# Patient Record
Sex: Female | Born: 1945 | Race: Black or African American | Hispanic: No | Marital: Single | Smoking: Never smoker
Health system: Southern US, Community
[De-identification: ages and names within clinical notes are randomized; demographics above are authoritative.]

## PROBLEM LIST (undated history)

## (undated) DIAGNOSIS — I10 Essential (primary) hypertension: Secondary | ICD-10-CM

## (undated) DIAGNOSIS — E119 Type 2 diabetes mellitus without complications: Secondary | ICD-10-CM

## (undated) HISTORY — PX: CATARACT EXTRACTION, BILATERAL: SHX1313

## (undated) HISTORY — DX: Type 2 diabetes mellitus without complications: E11.9

## (undated) HISTORY — PX: WRIST SURGERY: SHX841

## (undated) HISTORY — DX: Essential (primary) hypertension: I10

---

## 2005-04-16 ENCOUNTER — Emergency Department: Payer: Self-pay | Admitting: Emergency Medicine

## 2006-05-05 DIAGNOSIS — I10 Essential (primary) hypertension: Secondary | ICD-10-CM | POA: Insufficient documentation

## 2006-05-05 DIAGNOSIS — Z833 Family history of diabetes mellitus: Secondary | ICD-10-CM | POA: Insufficient documentation

## 2007-09-01 ENCOUNTER — Emergency Department: Payer: Self-pay | Admitting: Emergency Medicine

## 2009-08-01 ENCOUNTER — Emergency Department: Payer: Self-pay | Admitting: Emergency Medicine

## 2009-08-12 ENCOUNTER — Emergency Department: Payer: Self-pay | Admitting: Unknown Physician Specialty

## 2011-04-02 ENCOUNTER — Ambulatory Visit: Payer: Self-pay

## 2011-07-02 ENCOUNTER — Emergency Department: Payer: Self-pay | Admitting: Emergency Medicine

## 2011-07-15 ENCOUNTER — Ambulatory Visit: Payer: Self-pay | Admitting: Orthopedic Surgery

## 2011-07-16 ENCOUNTER — Ambulatory Visit: Payer: Self-pay | Admitting: Orthopedic Surgery

## 2012-12-07 IMAGING — CR LEFT WRIST - 2 VIEW
1 series · 2 of 2 positions shown · non-contrast
Comparison: none

REASON FOR EXAM: post op ORIF
COMMENTS:   Bedside (portable):Y

PROCEDURE:     DXR - DXR WRIST LEFT AP AND LATERAL  - July 16, 2011 [DATE]
RESULT:     The patient has a comminuted fracture of the distal left radius
that has been reduced and is now internally stabilized by a T-shaped volar
plate with multiple screws. The hardware is intact. A splint is present.

[Series 1: view not recorded · 0.17mm/px · 2 of 2 slices shown]
[im 1/2]
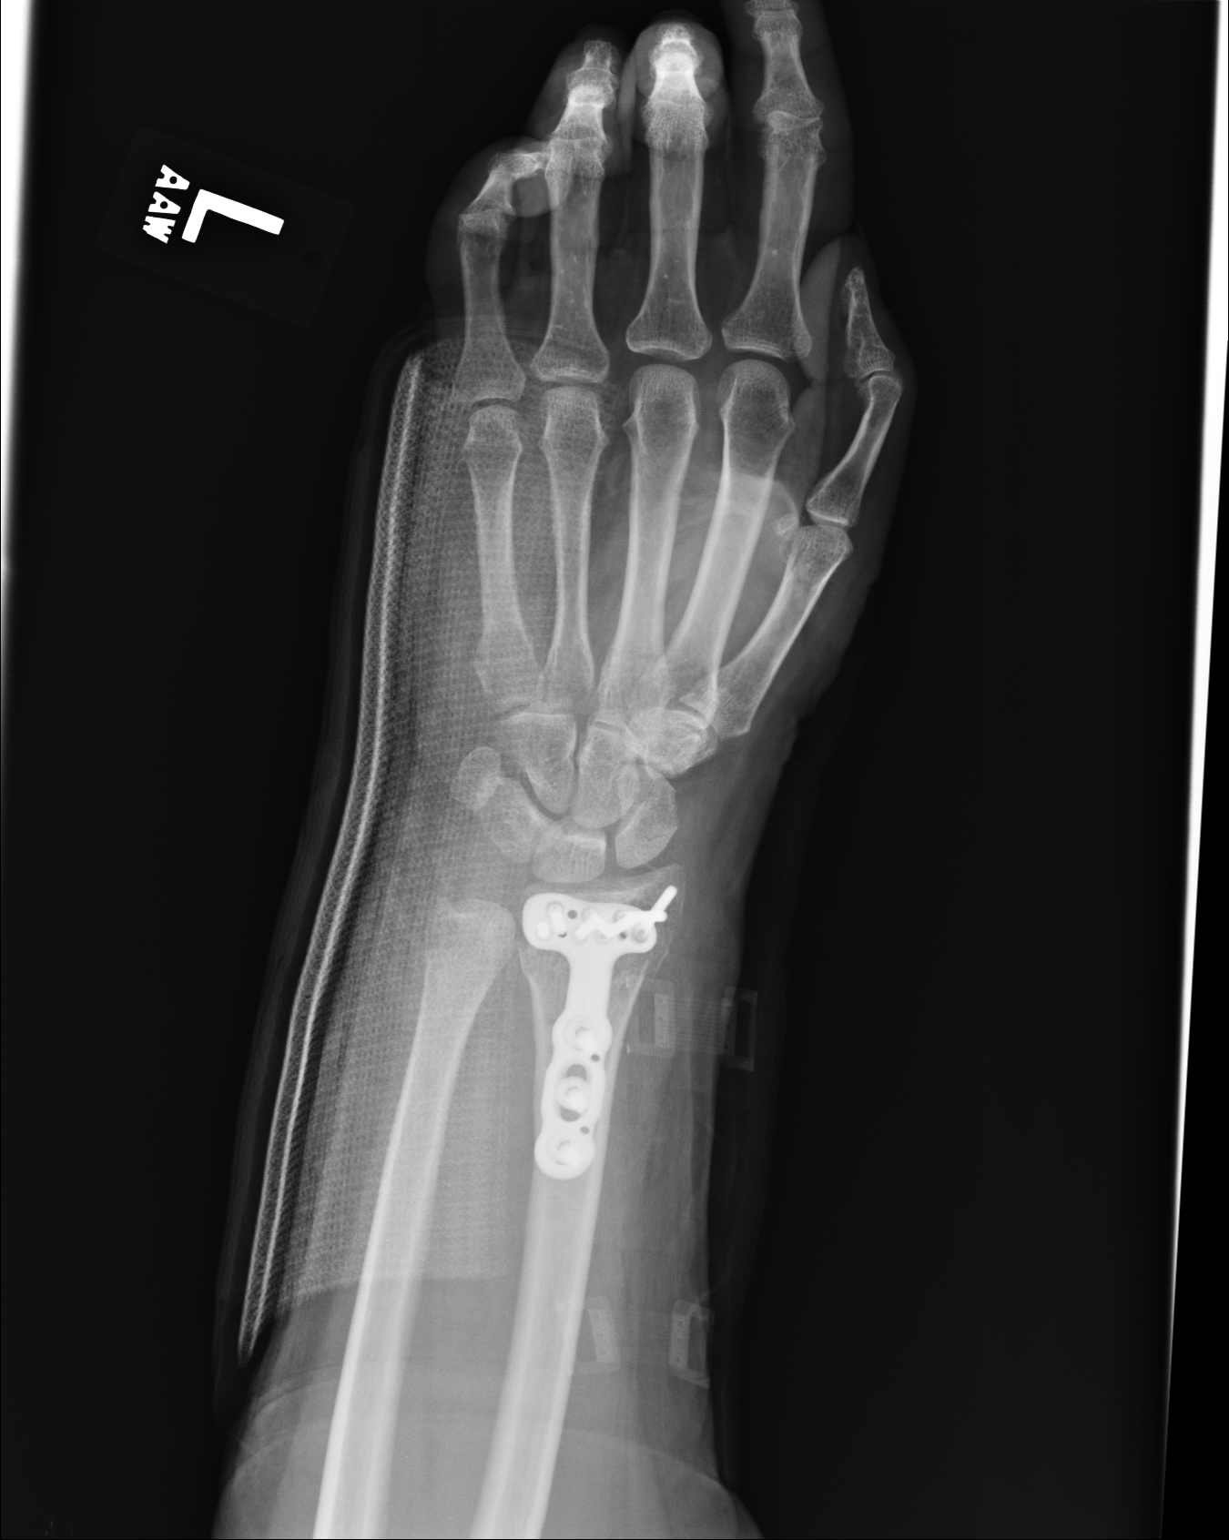
[im 2/2]
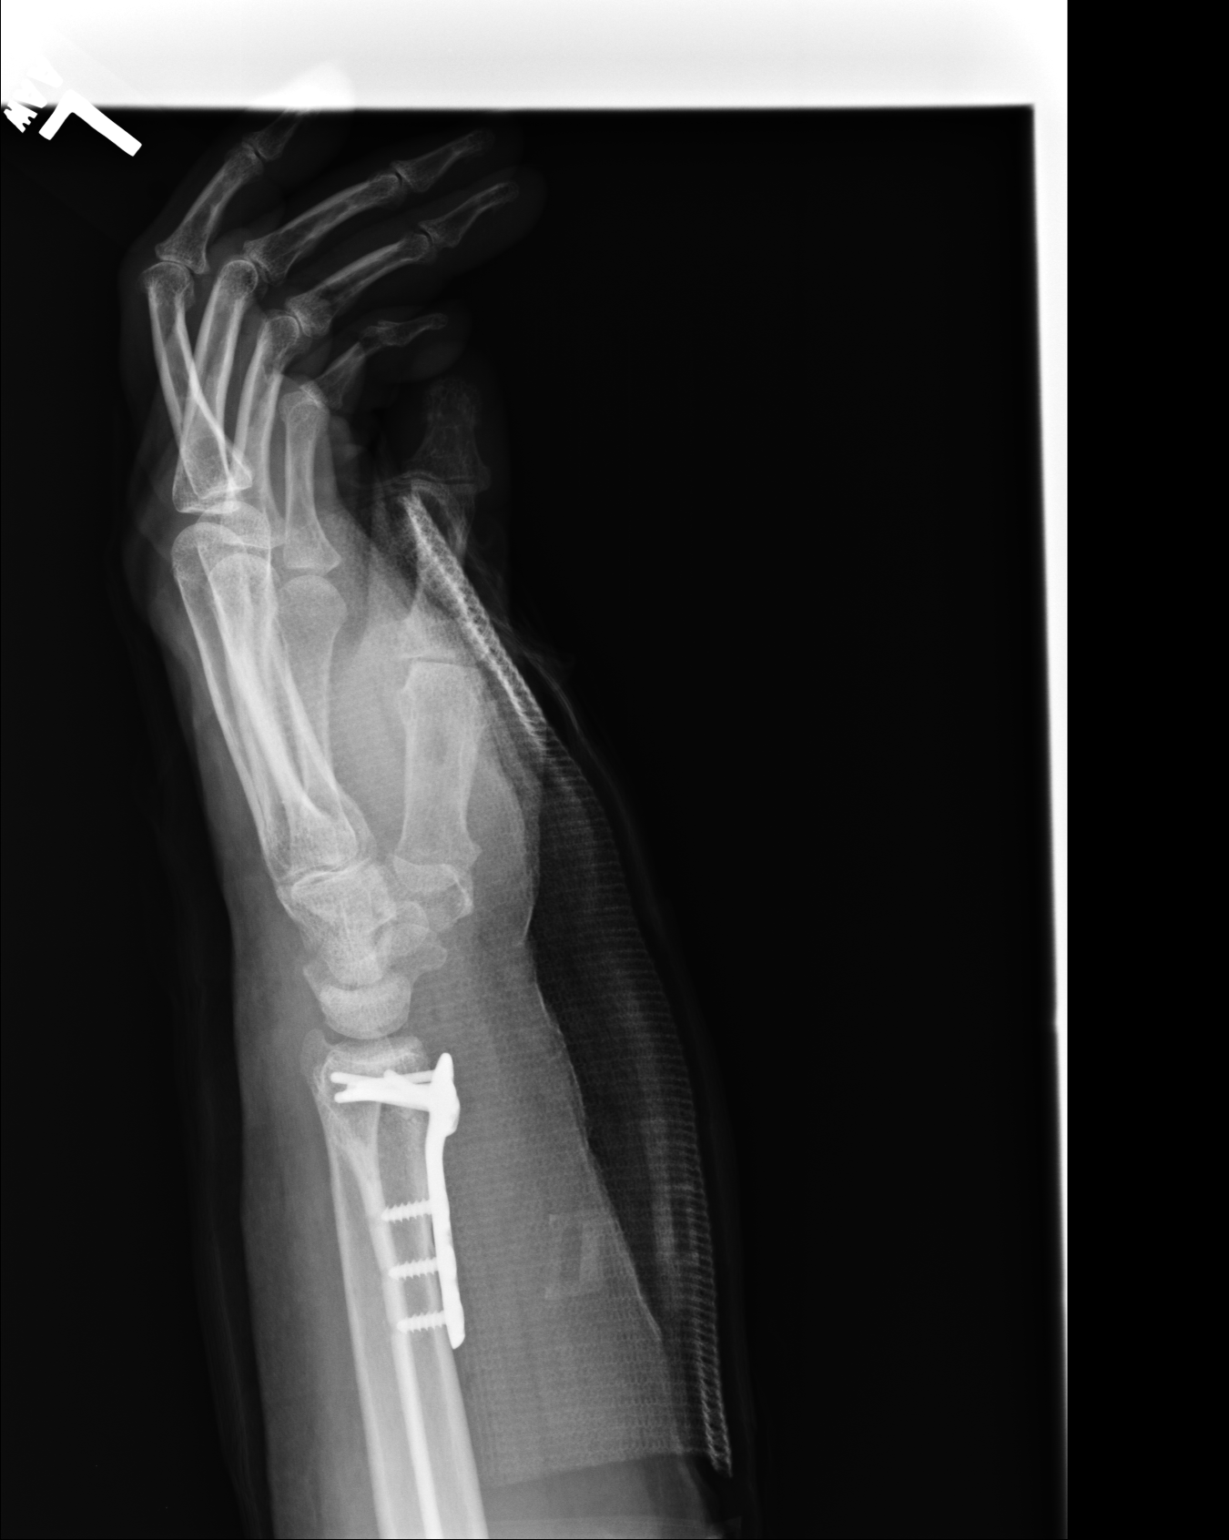

[2 of 2 positions shown; findings below may reference images not displayed]

IMPRESSION: The previously noted fracture of the distal left radius has been reduced and
internally stabilized as noted above.

## 2013-08-24 LAB — HEPATIC FUNCTION PANEL
ALT: 12 U/L (ref 7–35)
AST: 15 U/L (ref 13–35)
Alkaline Phosphatase: 76 U/L (ref 25–125)
Bilirubin, Total: 0.7 mg/dL

## 2013-08-24 LAB — LIPID PANEL
Cholesterol: 163 mg/dL (ref 0–200)
HDL: 40 mg/dL (ref 35–70)
LDL Cholesterol: 108 mg/dL
Triglycerides: 75 mg/dL (ref 40–160)

## 2013-08-24 LAB — CBC AND DIFFERENTIAL
HCT: 39 % (ref 36–46)
Hemoglobin: 13 g/dL (ref 12.0–16.0)
Neutrophils Absolute: 40 /uL
Platelets: 297 10*3/uL (ref 150–399)
WBC: 4.9 10*3/mL

## 2013-08-24 LAB — BASIC METABOLIC PANEL
BUN: 15 mg/dL (ref 4–21)
CREATININE: 0.8 mg/dL (ref 0.5–1.1)
Glucose: 92 mg/dL
POTASSIUM: 4.4 mmol/L (ref 3.4–5.3)
Sodium: 143 mmol/L (ref 137–147)

## 2013-08-24 LAB — TSH: TSH: 2.22 u[IU]/mL (ref 0.41–5.90)

## 2013-08-24 LAB — HEMOGLOBIN A1C: HEMOGLOBIN A1C: 7

## 2015-10-04 DIAGNOSIS — E119 Type 2 diabetes mellitus without complications: Secondary | ICD-10-CM | POA: Insufficient documentation

## 2015-10-06 ENCOUNTER — Encounter: Payer: Self-pay | Admitting: Family Medicine

## 2015-10-06 ENCOUNTER — Ambulatory Visit (INDEPENDENT_AMBULATORY_CARE_PROVIDER_SITE_OTHER): Payer: BLUE CROSS/BLUE SHIELD | Admitting: Family Medicine

## 2015-10-06 VITALS — BP 138/98 | HR 68 | Temp 97.6°F | Resp 14 | Wt 173.0 lb

## 2015-10-06 DIAGNOSIS — Z23 Encounter for immunization: Secondary | ICD-10-CM

## 2015-10-06 DIAGNOSIS — E119 Type 2 diabetes mellitus without complications: Secondary | ICD-10-CM

## 2015-10-06 DIAGNOSIS — I10 Essential (primary) hypertension: Secondary | ICD-10-CM | POA: Diagnosis not present

## 2015-10-06 LAB — POCT UA - MICROALBUMIN: MICROALBUMIN (UR) POC: NEGATIVE mg/L

## 2015-10-06 MED ORDER — LISINOPRIL-HYDROCHLOROTHIAZIDE 20-25 MG PO TABS: 1.0000 | ORAL_TABLET | Freq: Every day | ORAL | Status: DC

## 2015-10-06 MED ORDER — METFORMIN HCL 500 MG PO TABS: 500.0000 mg | ORAL_TABLET | Freq: Two times a day (BID) | ORAL | Status: DC

## 2015-10-06 NOTE — Progress Notes (Signed)
Patient ID: Krista Haley, female   DOB: Aug 10, 1946, 70 y.o.   MRN: [id number]   Patient: Krista Haley Female    DOB: [birth year]   70 y.o.   MRN: [id number] Visit Date: 10/06/2015  Today's Provider: Vernie Murders, PA   Chief Complaint  Patient presents with  . Hypertension  . Diabetes  . Follow-up   Subjective:    Hypertension This is a chronic problem. The current episode started more than 1 year ago. The problem is controlled. Associated symptoms include blurred vision. Pertinent negatives include no chest pain, palpitations or peripheral edema. Risk factors for coronary artery disease include diabetes mellitus. Past treatments include ACE inhibitors and diuretics. The current treatment provides significant improvement.  Diabetes She presents for her follow-up diabetic visit. She has type 2 diabetes mellitus. Her disease course has been stable. There are no hypoglycemic associated symptoms. Associated symptoms include blurred vision. Pertinent negatives for diabetes include no chest pain. (History of glaucoma and on eye drops now.) Symptoms are stable. 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 Diabetes mellitus, type 2 ([place]) 10/04/2015  . Adjustment disorder with depressed mood 07/21/2009  . Dermatitis, purulent 05/04/2008  . Essential (primary) hypertension 05/05/2006  . Family history of diabetes mellitus 05/05/2006   Past Surgical History  Procedure Laterality Date  . Wrist surgery Left    Family History  Problem Relation Age of Onset  . Diabetes Mother   . Hypertension Mother   . CVA Father   . Hypertension Father   . Heart attack Sister   . Diabetes Sister   . Kidney failure Sister   . COPD Brother   . Diabetes Brother   . Prostate cancer Paternal Grandfather   . Ovarian cancer Sister   . Hypertension Sister   . Lung cancer Sister    No Known Allergies   Previous Medications   IBUPROFEN  (MOTRIN IB) 200 MG TABLET    Take by mouth.   LISINOPRIL-HYDROCHLOROTHIAZIDE (PRINZIDE,ZESTORETIC) 20-25 MG TABLET    Take by mouth.   METFORMIN (GLUCOPHAGE) 500 MG TABLET    Take by mouth.   MULTIPLE VITAMINS-MINERALS PO    Take by mouth.    Review of Systems  Constitutional: Negative.   HENT: Negative.   Eyes: Positive for blurred vision.  Respiratory: Negative.   Cardiovascular: Negative.  Negative for chest pain and palpitations.  Gastrointestinal: Negative.   Endocrine: Negative.   Genitourinary: Negative.   Musculoskeletal: Negative.   Skin: Negative.   Allergic/Immunologic: Negative.   Neurological: Negative.   Hematological: Negative.   Psychiatric/Behavioral: Negative.     Social History  Substance Use Topics  . Smoking status: Never Smoker   . Smokeless tobacco: Never Used  . Alcohol Use: No   Objective:   BP 138/98 mmHg  Pulse 68  Temp(Src) 97.6 F (36.4 C) (Oral)  Resp 14  Wt 173 lb (78.472 kg)   Physical Exam  Constitutional: She is oriented to person, place, and time. She appears well-developed and well-nourished. No distress.  HENT:  Head: Normocephalic and atraumatic.  Right Ear: Hearing normal.  Left Ear: Hearing normal.  Nose: Nose normal.  Eyes: Conjunctivae, EOM and lids are normal. Right eye exhibits no discharge. Left eye exhibits no discharge. No scleral icterus.  Neck: Normal range of motion. Neck supple.  Cardiovascular: Normal rate, regular rhythm and normal heart sounds.   Pulmonary/Chest: Effort normal. No respiratory distress.  Abdominal: Soft. Bowel sounds are normal.  Musculoskeletal: Normal range of motion.  Neurological: She is alert and oriented to person, place, and time.  Normal sensation in both feet to test with nylon string.  Skin: Skin is intact. No lesion and no rash noted.  Psychiatric: She has a normal mood and affect. Her speech is normal and behavior is normal. Thought content normal.      Assessment & Plan:       1. Essential (primary) hypertension Has better control of BP at home. Fasting today and has not take Prinzide yet. Limit sodium intake and caffeine. Recheck routine labs and follow up pending report. - TSH - lisinopril-hydrochlorothiazide (PRINZIDE,ZESTORETIC) 20-25 MG tablet; Take 1 tablet by mouth daily.  Dispense: 90 tablet; Refill: 3  2. Type 2 diabetes mellitus without complication, without long-term current use of insulin ([place]) States she takes Metformin 500 mg 1 daily with occasional second does. RBS was 154 two days ago after eating at work (3rd shift). Will recheck labs and recommend she use the Metformin BID. Has had follow up with ophthalmologist and taking daily eyedrops for glaucoma now. Recheck pending lab reports. Urine microalbumin was negative today. Normal sensation in feet to test with nylon string. - CBC with Differential/Platelet - COMPLETE METABOLIC PANEL WITH GFR - Lipid panel - Hemoglobin A1c - metFORMIN (GLUCOPHAGE) 500 MG tablet; Take 1 tablet (500 mg total) by mouth 2 (two) times daily with a meal.  Dispense: 180 tablet; Refill: 3 - POCT UA - Microalbumin  3. Need for influenza vaccination - Flu Vaccine QUAD 36+ mos PF IM (Fluarix & Fluzone Quad PF)

## 2015-10-07 LAB — COMPREHENSIVE METABOLIC PANEL
ALBUMIN: 4.3 g/dL (ref 3.6–4.8)
ALT: 14 IU/L (ref 0–32)
AST: 16 IU/L (ref 0–40)
Albumin/Globulin Ratio: 1.5 (ref 1.1–2.5)
Alkaline Phosphatase: 89 IU/L (ref 39–117)
BUN / CREAT RATIO: 12 (ref 11–26)
BUN: 9 mg/dL (ref 8–27)
Bilirubin Total: 0.8 mg/dL (ref 0.0–1.2)
CALCIUM: 10.6 mg/dL — AB (ref 8.7–10.3)
CHLORIDE: 102 mmol/L (ref 96–106)
CO2: 27 mmol/L (ref 18–29)
Creatinine, Ser: 0.75 mg/dL (ref 0.57–1.00)
GFR calc Af Amer: 95 mL/min/{1.73_m2} (ref >59)
GFR calc non Af Amer: 82 mL/min/{1.73_m2} (ref >59)
GLOBULIN, TOTAL: 2.9 g/dL (ref 1.5–4.5)
GLUCOSE: 98 mg/dL (ref 65–99)
Potassium: 5.5 mmol/L — ABNORMAL HIGH (ref 3.5–5.2)
Sodium: 143 mmol/L (ref 134–144)
TOTAL PROTEIN: 7.2 g/dL (ref 6.0–8.5)

## 2015-10-07 LAB — CBC WITH DIFFERENTIAL/PLATELET
BASOS: 1 %
Basophils Absolute: 0 10*3/uL (ref 0.0–0.2)
EOS (ABSOLUTE): 0.1 10*3/uL (ref 0.0–0.4)
Eos: 1 %
Hematocrit: 40.9 % (ref 34.0–46.6)
Hemoglobin: 13.8 g/dL (ref 11.1–15.9)
IMMATURE GRANS (ABS): 0 10*3/uL (ref 0.0–0.1)
IMMATURE GRANULOCYTES: 0 %
LYMPHS: 48 %
Lymphocytes Absolute: 2.4 10*3/uL (ref 0.7–3.1)
MCH: 30.3 pg (ref 26.6–33.0)
MCHC: 33.7 g/dL (ref 31.5–35.7)
MCV: 90 fL (ref 79–97)
Monocytes Absolute: 0.4 10*3/uL (ref 0.1–0.9)
Monocytes: 7 %
NEUTROS PCT: 43 %
Neutrophils Absolute: 2.2 10*3/uL (ref 1.4–7.0)
PLATELETS: 303 10*3/uL (ref 150–379)
RBC: 4.56 x10E6/uL (ref 3.77–5.28)
RDW: 13.4 % (ref 12.3–15.4)
WBC: 5.1 10*3/uL (ref 3.4–10.8)

## 2015-10-07 LAB — HEMOGLOBIN A1C
Est. average glucose Bld gHb Est-mCnc: 146 mg/dL
Hgb A1c MFr Bld: 6.7 % — ABNORMAL HIGH (ref 4.8–5.6)

## 2015-10-07 LAB — LIPID PANEL
CHOLESTEROL TOTAL: 159 mg/dL (ref 100–199)
Chol/HDL Ratio: 3.5 ratio units (ref 0.0–4.4)
HDL: 45 mg/dL (ref >39)
LDL Calculated: 97 mg/dL (ref 0–99)
TRIGLYCERIDES: 85 mg/dL (ref 0–149)
VLDL CHOLESTEROL CAL: 17 mg/dL (ref 5–40)

## 2015-10-07 LAB — TSH: TSH: 2.17 u[IU]/mL (ref 0.450–4.500)

## 2015-10-10 ENCOUNTER — Telehealth: Payer: Self-pay

## 2015-10-10 NOTE — Telephone Encounter (Signed)
Number we have on file is no longer in service. Will mail letter saying that lab results are back.

## 2015-10-10 NOTE — Telephone Encounter (Signed)
-----   Message from Margo Common, [place] sent at 10/09/2015 11:26 PM EST ----- All blood tests essentially normal. Good Hgb A1C indicates diabetes well controlled with the Metformin and cholesterol in good shape. Get back on daily use of BP medication and restrict sodium intake. Recheck BP in 1 month to see if adjustment to BP medication needed.

## 2016-10-25 LAB — HM DIABETES EYE EXAM

## 2016-10-29 ENCOUNTER — Ambulatory Visit (INDEPENDENT_AMBULATORY_CARE_PROVIDER_SITE_OTHER): Payer: BLUE CROSS/BLUE SHIELD | Admitting: Family Medicine

## 2016-10-29 ENCOUNTER — Encounter: Payer: Self-pay | Admitting: Family Medicine

## 2016-10-29 VITALS — BP 116/82 | HR 72 | Temp 97.8°F | Resp 16 | Ht 64.0 in | Wt 178.0 lb

## 2016-10-29 DIAGNOSIS — M25541 Pain in joints of right hand: Secondary | ICD-10-CM

## 2016-10-29 DIAGNOSIS — E119 Type 2 diabetes mellitus without complications: Secondary | ICD-10-CM

## 2016-10-29 DIAGNOSIS — I10 Essential (primary) hypertension: Secondary | ICD-10-CM | POA: Diagnosis not present

## 2016-10-29 LAB — POCT GLYCOSYLATED HEMOGLOBIN (HGB A1C)
Est. average glucose Bld gHb Est-mCnc: 148
HEMOGLOBIN A1C: 6.8

## 2016-10-29 LAB — POCT UA - MICROALBUMIN: MICROALBUMIN (UR) POC: 20 mg/L

## 2016-10-29 MED ORDER — METFORMIN HCL 500 MG PO TABS: 500.0000 mg | ORAL_TABLET | Freq: Two times a day (BID) | ORAL | 3 refills | Status: DC

## 2016-10-29 MED ORDER — LISINOPRIL-HYDROCHLOROTHIAZIDE 20-25 MG PO TABS
1.0000 | ORAL_TABLET | Freq: Every day | ORAL | 3 refills | Status: DC
Start: 2016-10-29 — End: 2017-12-01

## 2016-10-29 NOTE — Progress Notes (Signed)
Patient: CHANEY OLIGER Female    DOB: 02-20-1946   71 y.o.   MRN: [id number] Visit Date: 10/29/2016  Today's Provider: Vernie Murders, PA   Chief Complaint  Patient presents with  . Hypertension  . Diabetes   Subjective:    HPI  Hypertension, follow-up:  BP Readings from Last 3 Encounters:  10/29/16 116/82  10/06/15 (!) 138/98    She was last seen for hypertension 1 years ago.  BP at that visit was 138/98. Management changes since that visit include check labs. She reports excellent compliance with treatment. She is not having side effects.  She is exercising. She is adherent to low salt diet.   Outside blood pressures are stable. She is experiencing none.  Patient denies chest pain and lower extremity edema.   Cardiovascular risk factors include advanced age (older than 67 for men, 68 for women), diabetes mellitus and hypertension.  Use of agents associated with hypertension: none.     Weight trend: stable Wt Readings from Last 3 Encounters:  10/29/16 178 lb (80.7 kg)  10/06/15 173 lb (78.5 kg)    Current diet: in general, a "healthy" diet    ------------------------------------------------------------------------   Diabetes Mellitus Type II, Follow-up:   Lab Results  Component Value Date   HGBA1C 6.8 10/29/2016   HGBA1C 6.7 (H) 10/06/2015   HGBA1C 7.0 08/24/2013    Last seen for diabetes 1 years ago.  Management since then includes check labs. She reports excellent compliance with treatment. She is not having side effects.  Current symptoms include none and have been stable. Home blood sugar records: fasting range: under 120  Episodes of hypoglycemia? no   Current Insulin Regimen: none Most Recent Eye Exam: 10/25/2016 Dr. Gloriann Loan Weight trend: stable Prior visit with dietician: no Current diet: in general, a "healthy" diet   Current exercise: walking  Pertinent Labs:    Component Value Date/Time   CHOL 159 10/06/2015 1144   TRIG 85 10/06/2015 1144   HDL 45 10/06/2015 1144   LDLCALC 97 10/06/2015 1144   CREATININE 0.75 10/06/2015 1144    Wt Readings from Last 3 Encounters:  10/29/16 178 lb (80.7 kg)  10/06/15 173 lb (78.5 kg)    ------------------------------------------------------------------------ Past Surgical History:  Procedure Laterality Date  . WRIST SURGERY Left    Family History  Problem Relation Age of Onset  . Diabetes Mother   . Hypertension Mother   . CVA Father   . Hypertension Father   . Heart attack Sister   . Diabetes Sister   . Kidney failure Sister   . COPD Brother   . Diabetes Brother   . Prostate cancer Paternal Grandfather   . Ovarian cancer Sister   . Hypertension Sister   . Lung cancer Sister     No Known Allergies   Patient Active Problem List   Diagnosis Date Noted  . Diabetes mellitus, type 2 ([place]) 10/04/2015  . Adjustment disorder with depressed mood 07/21/2009  . Dermatitis, purulent 05/04/2008  . Essential (primary) hypertension 05/05/2006  . Family history of diabetes mellitus 05/05/2006    Current Outpatient Prescriptions:  .  ibuprofen (MOTRIN IB) 200 MG tablet, Take by mouth., Disp: , Rfl:  .  lisinopril-hydrochlorothiazide (PRINZIDE,ZESTORETIC) 20-25 MG tablet, Take 1 tablet by mouth daily., Disp: 90 tablet, Rfl: 3 .  metFORMIN (GLUCOPHAGE) 500 MG tablet, Take 1 tablet (500 mg total) by mouth 2 (two) times daily with a meal., Disp: 180 tablet, Rfl: 3 .  MULTIPLE VITAMINS-MINERALS PO, Take by mouth., Disp: , Rfl:   Review of Systems  Constitutional: Negative.   Respiratory: Negative.   Cardiovascular: Negative.     Social History  Substance Use Topics  . Smoking status: Never Smoker  . Smokeless tobacco: Never Used  . Alcohol use No   Objective:   BP 116/82 (BP Location: Right Arm, Patient Position: Sitting, Cuff Size: Large)   Pulse 72   Temp 97.8 F (36.6 C) (Oral)   Resp 16   Ht 5\' 4"  (1.626 m)   Wt 178 lb (80.7 kg)   SpO2 99%    BMI 30.55 kg/m   Physical Exam  Constitutional: She is oriented to person, place, and time. She appears well-developed and well-nourished.  HENT:  Head: Normocephalic and atraumatic.  Right Ear: External ear normal.  Left Ear: External ear normal.  Nose: Nose normal.  Mouth/Throat: Oropharynx is clear and moist.  Eyes: Conjunctivae and EOM are normal. Pupils are equal, round, and reactive to light. Right eye exhibits no discharge.  Neck: Normal range of motion. Neck supple. No tracheal deviation present. No thyromegaly present.  Cardiovascular: Normal rate, regular rhythm, normal heart sounds and intact distal pulses.   No murmur heard. Pulmonary/Chest: Effort normal and breath sounds normal. No respiratory distress. She has no wheezes. She has no rales. She exhibits no tenderness.  Abdominal: Soft. She exhibits no distension and no mass. There is no tenderness. There is no rebound and no guarding.  Musculoskeletal: Normal range of motion. She exhibits no edema or tenderness.  Mild enlargement of PIP joints of the right 3rd and 4th fingers with intermittent soreness. Full ROM and good grip strength.  Lymphadenopathy:    She has no cervical adenopathy.  Neurological: She is alert and oriented to person, place, and time. No cranial nerve deficit. She exhibits normal muscle tone. Coordination normal.  Normal sensation in feet to test with nylon string. No callus or corn formation. No rashes or dry skin.  Skin: Skin is warm and dry. No rash noted. No erythema.  Psychiatric: She has a normal mood and affect. Her behavior is normal. Judgment and thought content normal.        Assessment & Plan:     1. Essential (primary) hypertension Improved BP. Tolerating Prinzide without side effects. Will check routine labs and refill medication. Recheck pending lab reports. - CBC with Differential/Platelet - Comprehensive metabolic panel - TSH - lisinopril-hydrochlorothiazide (PRINZIDE,ZESTORETIC)  20-25 MG tablet; Take 1 tablet by mouth daily.  Dispense: 90 tablet; Refill: 3  2. Type 2 diabetes mellitus without complication, without long-term current use of insulin (HCC) Hgb A1C 6.8 today. FBS around 100-120 at home (104 this morning). Having cataract surgery by Dr. Gloriann Loan in March 2018. Normal monofilament test of feet. Continue Metformin 500 mg 2 daily and diabetic diet. 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 Recheck labs and follow up pending reports. - POCT glycosylated hemoglobin (Hb A1C) - POCT UA - Microalbumin - CBC with Differential/Platelet - Comprehensive metabolic panel - Lipid panel - metFORMIN (GLUCOPHAGE) 500 MG tablet; Take 1 tablet (500 mg total) by mouth 2 (two) times daily with a meal.  Dispense: 180 tablet; Refill: 3  3. Arthralgia of right hand Some enlargement of the right 3rd and 4th finger PIP joints. May use Tylenol, Advil or Aspercreme for discomfort. Recheck prn.       Vernie Murders, PA  [facility]

## 2016-10-29 NOTE — Patient Instructions (Signed)
Arthritis Introduction Arthritis is a term that is commonly used to refer to joint pain or joint disease. There are more than 100 types of arthritis. What are the causes? The most common cause of this condition is wear and tear of a joint. Other causes include:  Gout.  Inflammation of a joint.  An infection of a joint.  Sprains and other injuries near the joint.  A drug reaction or allergic reaction. In some cases, the cause may not be known. What are the signs or symptoms? The main symptom of this condition is pain in the joint with movement. Other symptoms include:  Redness, swelling, or stiffness at a joint.  Warmth coming from the joint.  Fever.  Overall feeling of illness. How is this diagnosed? This condition may be diagnosed with a physical exam and tests, including:  Blood tests.  Urine tests.  Imaging tests, such as MRI, X-rays, or a CT scan. Sometimes, fluid is removed from a joint for testing. How is this treated? Treatment for this condition may involve:  Treatment of the cause, if it is known.  Rest.  Raising (elevating) the joint.  Applying cold or hot packs to the joint.  Medicines to improve symptoms and reduce inflammation.  Injections of a steroid such as cortisone into the joint to help reduce pain and inflammation. Depending on the cause of your arthritis, you may need to make lifestyle changes to reduce stress on your joint. These changes may include exercising more and losing weight. Follow these instructions at home: Medicines  Take over-the-counter and prescription medicines only as told by your health care provider.  Do not take aspirin to relieve pain if gout is suspected. Activity  Rest your joint if told by your health care provider. Rest is important when your disease is active and your joint feels painful, swollen, or stiff.  Avoid activities that make the pain worse. It is important to balance activity with rest.  Exercise  your joint regularly with range-of-motion exercises as told by your health care provider. Try doing low-impact exercise, such as:  Swimming.  Water aerobics.  Biking.  Walking. Joint Care   If your joint is swollen, keep it elevated if told by your health care provider.  If your joint feels stiff in the morning, try taking a warm shower.  If directed, apply heat to the joint. If you have diabetes, do not apply heat without permission from your health care provider.  Put a towel between the joint and the hot pack or heating pad.  Leave the heat on the area for 20-30 minutes.  If directed, apply ice to the joint:  Put ice in a plastic bag.  Place a towel between your skin and the bag.  Leave the ice on for 20 minutes, 2-3 times per day.  Keep all follow-up visits as told by your health care provider. This is important. Contact a health care provider if:  The pain gets worse.  You have a fever. Get help right away if:  You develop severe joint pain, swelling, or redness.  Many joints become painful and swollen.  You develop severe back pain.  You develop severe weakness in your leg.  You cannot control your bladder or bowels. This information is not intended to replace advice given to you by your health care provider. Make sure you discuss any questions you have with your health care provider. Document Released: 10/24/2004 Document Revised: 02/22/2016 Document Reviewed: 12/12/2014  2017 Elsevier  

## 2016-10-30 LAB — COMPREHENSIVE METABOLIC PANEL
A/G RATIO: 1.5 (ref 1.2–2.2)
ALBUMIN: 4.4 g/dL (ref 3.6–4.8)
ALK PHOS: 84 IU/L (ref 39–117)
ALT: 12 IU/L (ref 0–32)
AST: 15 IU/L (ref 0–40)
BILIRUBIN TOTAL: 0.6 mg/dL (ref 0.0–1.2)
BUN / CREAT RATIO: 18 (ref 12–28)
BUN: 14 mg/dL (ref 8–27)
CHLORIDE: 101 mmol/L (ref 96–106)
CO2: 30 mmol/L — ABNORMAL HIGH (ref 18–29)
Calcium: 10.1 mg/dL (ref 8.7–10.3)
Creatinine, Ser: 0.76 mg/dL (ref 0.57–1.00)
GFR calc Af Amer: 93 mL/min/{1.73_m2} (ref >59)
GFR calc non Af Amer: 80 mL/min/{1.73_m2} (ref >59)
Globulin, Total: 3 g/dL (ref 1.5–4.5)
Glucose: 109 mg/dL — ABNORMAL HIGH (ref 65–99)
POTASSIUM: 3.7 mmol/L (ref 3.5–5.2)
Sodium: 144 mmol/L (ref 134–144)
Total Protein: 7.4 g/dL (ref 6.0–8.5)

## 2016-10-30 LAB — LIPID PANEL
CHOLESTEROL TOTAL: 174 mg/dL (ref 100–199)
Chol/HDL Ratio: 4 ratio units (ref 0.0–4.4)
HDL: 43 mg/dL (ref >39)
LDL Calculated: 114 mg/dL — ABNORMAL HIGH (ref 0–99)
Triglycerides: 84 mg/dL (ref 0–149)
VLDL CHOLESTEROL CAL: 17 mg/dL (ref 5–40)

## 2016-10-30 LAB — CBC WITH DIFFERENTIAL/PLATELET
BASOS ABS: 0 10*3/uL (ref 0.0–0.2)
Basos: 0 %
EOS (ABSOLUTE): 0.1 10*3/uL (ref 0.0–0.4)
Eos: 1 %
HEMOGLOBIN: 13 g/dL (ref 11.1–15.9)
Hematocrit: 38.2 % (ref 34.0–46.6)
Immature Grans (Abs): 0 10*3/uL (ref 0.0–0.1)
Immature Granulocytes: 0 %
LYMPHS ABS: 3.2 10*3/uL — AB (ref 0.7–3.1)
Lymphs: 49 %
MCH: 30.4 pg (ref 26.6–33.0)
MCHC: 34 g/dL (ref 31.5–35.7)
MCV: 90 fL (ref 79–97)
Monocytes Absolute: 0.5 10*3/uL (ref 0.1–0.9)
Monocytes: 7 %
NEUTROS ABS: 2.8 10*3/uL (ref 1.4–7.0)
Neutrophils: 43 %
PLATELETS: 312 10*3/uL (ref 150–379)
RBC: 4.27 x10E6/uL (ref 3.77–5.28)
RDW: 13.5 % (ref 12.3–15.4)
WBC: 6.6 10*3/uL (ref 3.4–10.8)

## 2016-10-30 LAB — TSH: TSH: 3.11 u[IU]/mL (ref 0.450–4.500)

## 2016-12-02 ENCOUNTER — Telehealth: Payer: Self-pay | Admitting: Family Medicine

## 2016-12-02 NOTE — Telephone Encounter (Signed)
Pt is returning call about her lab results.  CB# [phone]

## 2016-12-02 NOTE — Telephone Encounter (Signed)
Lmtcb. Oreste Majeed Drozdowski, CMA  

## 2016-12-03 NOTE — Telephone Encounter (Signed)
Pt advised of most recent lab results and 3 month FU scheduled. Renaldo Fiddler, CMA

## 2017-03-06 ENCOUNTER — Ambulatory Visit (INDEPENDENT_AMBULATORY_CARE_PROVIDER_SITE_OTHER): Payer: BLUE CROSS/BLUE SHIELD | Admitting: Family Medicine

## 2017-03-06 ENCOUNTER — Encounter: Payer: Self-pay | Admitting: Family Medicine

## 2017-03-06 VITALS — BP 130/82 | HR 72 | Temp 97.8°F | Wt 177.0 lb

## 2017-03-06 DIAGNOSIS — I10 Essential (primary) hypertension: Secondary | ICD-10-CM

## 2017-03-06 DIAGNOSIS — E119 Type 2 diabetes mellitus without complications: Secondary | ICD-10-CM | POA: Diagnosis not present

## 2017-03-06 DIAGNOSIS — Z1159 Encounter for screening for other viral diseases: Secondary | ICD-10-CM

## 2017-03-06 NOTE — Progress Notes (Signed)
Patient: Krista Haley Female    DOB: 1946-07-05   71 y.o.   MRN: [id number] Visit Date: 03/06/2017  Today's Provider: Vernie Murders, PA   Chief Complaint  Patient presents with  . Diabetes  . Hypertension  . Follow-up   Subjective:    HPI  Diabetes Mellitus Type II, Follow-up:   Lab Results  Component Value Date   HGBA1C 6.8 10/29/2016   HGBA1C 6.7 (H) 10/06/2015   HGBA1C 7.0 08/24/2013   Last seen for diabetes 4 months ago.  Management since then includes continue medications and watch fats in diet.  She reports good compliance with treatment. She is not having side effects.  Current symptoms include none  Home blood sugar records: being checked at times. This morning FBS was 130  Episodes of hypoglycemia? no   Current Insulin Regimen: none Most Recent Eye Exam: 10/25/2016 Dr. Gloriann Loan Weight trend: stable Prior visit with dietician: no Current diet: in general, a "healthy" diet   Current exercise: walking  ------------------------------------------------------------------------   Hypertension, follow-up:  BP Readings from Last 3 Encounters:  03/06/17 130/82  10/29/16 116/82  10/06/15 (!) 138/98    She was last seen for hypertension 4 months ago.  BP at that visit was 116/82. Management since that visit includes continue medications and watch fats in diet. .She reports good compliance with treatment. She is not having side effects.  She is exercising. She is adherent to low salt diet.   Outside blood pressures are not being checked. She is experiencing none.  Patient denies chest pain, chest pressure/discomfort, irregular heart beat and palpitations.   Cardiovascular risk factors include advanced age (older than 22 for men, 43 for women), diabetes mellitus and hypertension.  Use of agents associated with hypertension: none.   ------------------------------------------------------------------------ Patient Active Problem List   Diagnosis Date  Noted  . Diabetes mellitus, type 2 ([place]) 10/04/2015  . Adjustment disorder with depressed mood 07/21/2009  . Dermatitis, purulent 05/04/2008  . Essential (primary) hypertension 05/05/2006  . Family history of diabetes mellitus 05/05/2006   Past Surgical History:  Procedure Laterality Date  . WRIST SURGERY Left    Family History  Problem Relation Age of Onset  . Diabetes Mother   . Hypertension Mother   . CVA Father   . Hypertension Father   . Heart attack Sister   . Diabetes Sister   . Kidney failure Sister   . COPD Brother   . Diabetes Brother   . Prostate cancer Paternal Grandfather   . Ovarian cancer Sister   . Hypertension Sister   . Lung cancer Sister    No Known Allergies   Previous Medications   IBUPROFEN (MOTRIN IB) 200 MG TABLET    Take by mouth.   LISINOPRIL-HYDROCHLOROTHIAZIDE (PRINZIDE,ZESTORETIC) 20-25 MG TABLET    Take 1 tablet by mouth daily.   METFORMIN (GLUCOPHAGE) 500 MG TABLET    Take 1 tablet (500 mg total) by mouth 2 (two) times daily with a meal.   MULTIPLE VITAMINS-MINERALS PO    Take by mouth.    Review of Systems  Constitutional: Negative.   Respiratory: Negative.   Cardiovascular: Negative.   Endocrine: Negative.     Social History  Substance Use Topics  . Smoking status: Never Smoker  . Smokeless tobacco: Never Used  . Alcohol use No   Objective:   BP 130/82 (BP Location: Right Arm, Patient Position: Sitting, Cuff Size: Normal)   Pulse 72   Temp 97.8 F (36.6 C) (Oral)  Wt 177 lb (80.3 kg)   SpO2 99%   BMI 30.38 kg/m   Physical Exam  Constitutional: She is oriented to person, place, and time. She appears well-developed and well-nourished. No distress.  HENT:  Head: Normocephalic and atraumatic.  Right Ear: Hearing normal.  Left Ear: Hearing normal.  Nose: Nose normal.  Eyes: Conjunctivae and lids are normal. Right eye exhibits no discharge. Left eye exhibits no discharge. No scleral icterus.  Neck: Neck supple. No  thyromegaly present.  Cardiovascular: Normal rate and regular rhythm.   Pulmonary/Chest: Effort normal and breath sounds normal. No respiratory distress.  Abdominal: Soft. Bowel sounds are normal.  Musculoskeletal: Normal range of motion.  Neurological: She is alert and oriented to person, place, and time.  Normal sensation both feet to monofilament test.  Skin: Skin is intact. No lesion and no rash noted.  Psychiatric: She has a normal mood and affect. Her speech is normal and behavior is normal. Thought content normal.      Assessment & Plan:     1. Type 2 diabetes mellitus without complication, without long-term current use of insulin (HCC) Has been taking Metformin 500 mg once a day instead of twice a day. States she has had one 209 FBS reading recently. Had cataract surgery and follow up eye exams March and April this year. Foot exam normal without peripheral neuropathy. Continue to work on diet and take Metformin BID regularly. Recheck labs. - CBC with Differential/Platelet - Comprehensive metabolic panel - Hemoglobin A1c  2. Essential (primary) hypertension Good control and tolerating Prinzide 20-25 mg qd without edema or cough. Recheck CBC and CMP. Follow up pending lab reports. - CBC with Differential/Platelet - Comprehensive metabolic panel  3. Need for hepatitis C screening test - Hepatitis C Antibody

## 2017-03-11 ENCOUNTER — Telehealth: Payer: Self-pay | Admitting: Family Medicine

## 2017-03-11 NOTE — Telephone Encounter (Signed)
Contact number in chart is invalid. Letter mailed to patient.

## 2017-03-21 ENCOUNTER — Ambulatory Visit (INDEPENDENT_AMBULATORY_CARE_PROVIDER_SITE_OTHER): Payer: BLUE CROSS/BLUE SHIELD | Admitting: Family Medicine

## 2017-03-21 ENCOUNTER — Encounter: Payer: Self-pay | Admitting: Family Medicine

## 2017-03-21 VITALS — BP 122/80 | HR 60 | Temp 98.6°F | Resp 16 | Wt 175.6 lb

## 2017-03-21 DIAGNOSIS — J069 Acute upper respiratory infection, unspecified: Secondary | ICD-10-CM

## 2017-03-21 MED ORDER — AMOXICILLIN-POT CLAVULANATE 875-125 MG PO TABS: 1.0000 | ORAL_TABLET | Freq: Two times a day (BID) | ORAL | 0 refills | Status: DC

## 2017-03-21 NOTE — Progress Notes (Signed)
Subjective:     Patient ID: Krista Haley, female   DOB: May 13, 1946, 71 y.o.   MRN: [id number]  HPI  Chief Complaint  Patient presents with  . Sinus Problem    Patient comes into office today with complaints of sinus pressure covering her entire head for the past 7 days or more. Associated with sinus pressure paitent complains of watery eyes and runny nose, patient has been taking otc Ibuprofen for relief.   Reports cold sx with continuing clear sinus drainage and sneezing. Denies hx of allergies.   Review of Systems     Objective:   Physical Exam  Constitutional: She appears well-developed and well-nourished. No distress.  Ears: T.M's intact without inflammation Sinuses: non-tender Throat: no tonsillar enlargement or exudate Neck: no cervical adenopathy Lungs: clear     Assessment:    1. Viral upper respiratory tract infection - amoxicillin-clavulanate (AUGMENTIN) 875-125 MG tablet; Take 1 tablet by mouth 2 (two) times daily.  Dispense: 20 tablet; Refill: 0    Plan:    Start Mucinex D. Start abx for non-improvement over the next two days or onset of purulent drainage.

## 2017-03-21 NOTE — Patient Instructions (Signed)
Start with Mucinex D. If sinuses not clearing over the next 2-3 days or you see colored drainage start the antibiotic.

## 2017-03-22 LAB — CBC WITH DIFFERENTIAL/PLATELET
BASOS: 0 %
Basophils Absolute: 0 10*3/uL (ref 0.0–0.2)
EOS (ABSOLUTE): 0.1 10*3/uL (ref 0.0–0.4)
EOS: 2 %
HEMATOCRIT: 39.4 % (ref 34.0–46.6)
HEMOGLOBIN: 12.6 g/dL (ref 11.1–15.9)
IMMATURE GRANULOCYTES: 0 %
Immature Grans (Abs): 0 10*3/uL (ref 0.0–0.1)
LYMPHS ABS: 1.9 10*3/uL (ref 0.7–3.1)
Lymphs: 46 %
MCH: 28.6 pg (ref 26.6–33.0)
MCHC: 32 g/dL (ref 31.5–35.7)
MCV: 89 fL (ref 79–97)
MONOCYTES: 6 %
MONOS ABS: 0.2 10*3/uL (ref 0.1–0.9)
NEUTROS PCT: 46 %
Neutrophils Absolute: 1.9 10*3/uL (ref 1.4–7.0)
Platelets: 299 10*3/uL (ref 150–379)
RBC: 4.41 x10E6/uL (ref 3.77–5.28)
RDW: 13.6 % (ref 12.3–15.4)
WBC: 4.2 10*3/uL (ref 3.4–10.8)

## 2017-03-22 LAB — COMPREHENSIVE METABOLIC PANEL
A/G RATIO: 1.5 (ref 1.2–2.2)
ALBUMIN: 4 g/dL (ref 3.6–4.8)
ALT: 10 IU/L (ref 0–32)
AST: 16 IU/L (ref 0–40)
Alkaline Phosphatase: 76 IU/L (ref 39–117)
BUN / CREAT RATIO: 19 (ref 12–28)
BUN: 15 mg/dL (ref 8–27)
Bilirubin Total: 0.6 mg/dL (ref 0.0–1.2)
CALCIUM: 9.9 mg/dL (ref 8.7–10.3)
CO2: 29 mmol/L (ref 20–29)
CREATININE: 0.78 mg/dL (ref 0.57–1.00)
Chloride: 105 mmol/L (ref 96–106)
GFR, EST AFRICAN AMERICAN: 90 mL/min/{1.73_m2} (ref >59)
GFR, EST NON AFRICAN AMERICAN: 78 mL/min/{1.73_m2} (ref >59)
GLOBULIN, TOTAL: 2.7 g/dL (ref 1.5–4.5)
Glucose: 108 mg/dL — ABNORMAL HIGH (ref 65–99)
POTASSIUM: 4 mmol/L (ref 3.5–5.2)
SODIUM: 147 mmol/L — AB (ref 134–144)
TOTAL PROTEIN: 6.7 g/dL (ref 6.0–8.5)

## 2017-03-22 LAB — HEPATITIS C ANTIBODY: Hep C Virus Ab: <0.1 s/co ratio (ref 0.0–0.9)

## 2017-03-22 LAB — HEMOGLOBIN A1C
ESTIMATED AVERAGE GLUCOSE: 134 mg/dL
Hgb A1c MFr Bld: 6.3 % — ABNORMAL HIGH (ref 4.8–5.6)

## 2017-03-24 ENCOUNTER — Telehealth: Payer: Self-pay

## 2017-03-24 NOTE — Telephone Encounter (Signed)
Attempted to contact patient. Number in chart is invalid. Letter mailed to patient.

## 2017-03-24 NOTE — Telephone Encounter (Signed)
-----   Message from Margo Common, [place] sent at 03/24/2017  8:08 AM EDT ----- Diabetes in good control. Continue present diet and medications. Sodium slightly elevated. Be sure to drink at least 32 ounce of water a day. Recheck progress in 6 months.

## 2017-12-01 ENCOUNTER — Other Ambulatory Visit: Payer: Self-pay | Admitting: Family Medicine

## 2017-12-01 DIAGNOSIS — E119 Type 2 diabetes mellitus without complications: Secondary | ICD-10-CM

## 2017-12-01 DIAGNOSIS — I10 Essential (primary) hypertension: Secondary | ICD-10-CM

## 2017-12-01 MED ORDER — METFORMIN HCL 500 MG PO TABS: 500.0000 mg | ORAL_TABLET | Freq: Two times a day (BID) | ORAL | 3 refills | Status: AC

## 2017-12-01 MED ORDER — LISINOPRIL-HYDROCHLOROTHIAZIDE 20-25 MG PO TABS: 1.0000 | ORAL_TABLET | Freq: Every day | ORAL | 3 refills | Status: AC

## 2017-12-01 NOTE — Telephone Encounter (Signed)
Patient is requesting a refill on the following medications  lisinopril-hydrochlorothiazide (PRINZIDE,ZESTORETIC) 20-25 MG tablet   metFORMIN (GLUCOPHAGE) 500 MG tablet  [facility] on [address]

## 2017-12-04 ENCOUNTER — Encounter: Payer: Self-pay | Admitting: Family Medicine

## 2017-12-04 ENCOUNTER — Ambulatory Visit (INDEPENDENT_AMBULATORY_CARE_PROVIDER_SITE_OTHER): Payer: BLUE CROSS/BLUE SHIELD | Admitting: Family Medicine

## 2017-12-04 VITALS — BP 132/84 | HR 70 | Temp 97.8°F | Ht 64.0 in | Wt 173.8 lb

## 2017-12-04 DIAGNOSIS — Z13820 Encounter for screening for osteoporosis: Secondary | ICD-10-CM | POA: Diagnosis not present

## 2017-12-04 DIAGNOSIS — Z1231 Encounter for screening mammogram for malignant neoplasm of breast: Secondary | ICD-10-CM | POA: Diagnosis not present

## 2017-12-04 DIAGNOSIS — Z0000 Encounter for general adult medical examination without abnormal findings: Secondary | ICD-10-CM | POA: Diagnosis not present

## 2017-12-04 DIAGNOSIS — E119 Type 2 diabetes mellitus without complications: Secondary | ICD-10-CM | POA: Diagnosis not present

## 2017-12-04 DIAGNOSIS — I10 Essential (primary) hypertension: Secondary | ICD-10-CM

## 2017-12-04 DIAGNOSIS — Z1211 Encounter for screening for malignant neoplasm of colon: Secondary | ICD-10-CM

## 2017-12-04 DIAGNOSIS — Z23 Encounter for immunization: Secondary | ICD-10-CM | POA: Diagnosis not present

## 2017-12-04 NOTE — Progress Notes (Signed)
Patient: Krista Haley, Female    DOB: 01-27-1946, 72 y.o.   MRN: [id number] Visit Date: 12/04/2017  Today's Provider: Vernie Murders, PA   Chief Complaint  Patient presents with  . Annual Exam   Subjective:    Annual physical exam Krista Haley is a 72 y.o. female who presents today for health maintenance and complete physical. She feels well. She reports exercising none. She reports she is sleeping well.  -----------------------------------------------------------------   Review of Systems  Constitutional: Negative.   HENT: Negative.   Eyes: Negative.   Respiratory: Negative.   Cardiovascular: Negative.   Gastrointestinal: Negative.   Endocrine: Negative.   Genitourinary: Negative.   Musculoskeletal: Negative.   Skin: Negative.   Allergic/Immunologic: Negative.   Neurological: Negative.   Hematological: Negative.   Psychiatric/Behavioral: Negative.     Social History      She  reports that  has never smoked. she has never used smokeless tobacco. She reports that she does not drink alcohol or use drugs.       Social History   Socioeconomic History  . Marital status: Single    Spouse name: None  . Number of children: None  . Years of education: None  . Highest education level: None  Social Needs  . Financial resource strain: None  . Food insecurity - worry: None  . Food insecurity - inability: None  . Transportation needs - medical: None  . Transportation needs - non-medical: None  Occupational History  . None  Tobacco Use  . Smoking status: Never Smoker  . Smokeless tobacco: Never Used  Substance and Sexual Activity  . Alcohol use: No    Alcohol/week: 0.0 oz  . Drug use: No  . Sexual activity: None  Other Topics Concern  . None  Social History Narrative  . None   History reviewed. No pertinent past medical history.  Patient Active Problem List   Diagnosis Date Noted  . Diabetes mellitus, type 2 ([place]) 10/04/2015  . Adjustment  disorder with depressed mood 07/21/2009  . Dermatitis, purulent 05/04/2008  . Essential (primary) hypertension 05/05/2006  . Family history of diabetes mellitus 05/05/2006   Past Surgical History:  Procedure Laterality Date  . WRIST SURGERY Left     Family History        Family Status  Relation Name Status  . Mother  Deceased at age 44  . Father  Deceased at age 58  . Sister  Deceased  . Brother  Deceased at age 62  . MGF  Deceased  . PGF  Deceased  . Sister  Deceased  . Sister  Deceased        Her family history includes COPD in her brother; CVA in her father; Diabetes in her brother, mother, and sister; Heart attack in her sister; Hypertension in her father, mother, and sister; Kidney failure in her sister; Lung cancer in her sister; Ovarian cancer in her sister; Prostate cancer in her paternal grandfather.     No Known Allergies  Current Outpatient Medications:  .  ibuprofen (MOTRIN IB) 200 MG tablet, Take by mouth., Disp: , Rfl:  .  lisinopril-hydrochlorothiazide (PRINZIDE,ZESTORETIC) 20-25 MG tablet, Take 1 tablet by mouth daily., Disp: 90 tablet, Rfl: 3 .  metFORMIN (GLUCOPHAGE) 500 MG tablet, Take 1 tablet (500 mg total) by mouth 2 (two) times daily with a meal., Disp: 180 tablet, Rfl: 3 .  MULTIPLE VITAMINS-MINERALS PO, Take by mouth., Disp: , Rfl:    Patient Care Team:  Arelis Neumeier, Vickki Muff, PA as PCP - General (Family Medicine)     Objective:   Vitals: BP 132/84 (BP Location: Right Arm, Patient Position: Sitting, Cuff Size: Normal)   Pulse 70   Temp 97.8 F (36.6 C) (Oral)   Ht 5\' 4"  (1.626 m)   Wt 173 lb 12.8 oz (78.8 kg)   SpO2 98%   BMI 29.83 kg/m  Wt Readings from Last 3 Encounters:  12/04/17 173 lb 12.8 oz (78.8 kg)  03/21/17 175 lb 9.6 oz (79.7 kg)  03/06/17 177 lb (80.3 kg)   BP Readings from Last 3 Encounters:  12/04/17 132/84  03/21/17 122/80  03/06/17 130/82   Physical Exam  Constitutional: She is oriented to person, place, and time. She  appears well-developed and well-nourished.  HENT:  Head: Normocephalic and atraumatic.  Right Ear: External ear normal.  Left Ear: External ear normal.  Nose: Nose normal.  Mouth/Throat: Oropharynx is clear and moist.  Eyes: Conjunctivae and EOM are normal. Pupils are equal, round, and reactive to light. Right eye exhibits no discharge.  Neck: Normal range of motion. Neck supple. No tracheal deviation present. No thyromegaly present.  Cardiovascular: Normal rate, regular rhythm, normal heart sounds and intact distal pulses.  No murmur heard. Pulmonary/Chest: Effort normal and breath sounds normal. No respiratory distress. She has no wheezes. She has no rales. She exhibits no tenderness.  Abdominal: Soft. She exhibits no distension and no mass. There is no tenderness. There is no rebound and no guarding.  Genitourinary:  Genitourinary Comments: Declines exam. Asymptomatic and has not noticed any breast masses on self-exam at home.  Musculoskeletal: Normal range of motion. She exhibits no edema or tenderness.  Lymphadenopathy:    She has no cervical adenopathy.  Neurological: She is alert and oriented to person, place, and time. She has normal reflexes. No cranial nerve deficit. She exhibits normal muscle tone. Coordination normal.  Skin: Skin is warm and dry. No rash noted. No erythema.  Psychiatric: She has a normal mood and affect. Her behavior is normal. Judgment and thought content normal.   Diabetic Foot Form - Detailed   Diabetic Foot Exam - detailed Diabetic Foot exam was performed with the following findings:  Yes 12/04/2017  2:57 PM  Visual Foot Exam completed.:  Yes  Can the patient see the bottom of their feet?:  Yes Are the shoes appropriate in style and fit?:  Yes Is there swelling or and abnormal foot shape?:  No Is there a claw toe deformity?:  No Is there elevated skin temparature?:  No Is there foot or ankle muscle weakness?:  No Normal Range of Motion:  Yes Pulse Foot  Exam completed.:  Yes  Right posterior Tibialias:  Present Left posterior Tibialias:  Present  Right Dorsalis Pedis:  Present Left Dorsalis Pedis:  Present  Sensory Foot Exam Completed.:  Yes Semmes-Weinstein Monofilament Test R Site 1-Great Toe:  Pos L Site 1-Great Toe:  Pos        Depression Screen PHQ 2/9 Scores 10/29/2016  PHQ - 2 Score 0   Assessment & Plan:     Routine Health Maintenance and Physical Exam  Exercise Activities and Dietary recommendations Goals    Encouraged to exercise by walking 30 minutes 3-4 days a week and follow diabetic diet.      Immunization History  Administered Date(s) Administered  . Influenza Split 10/27/2009  . Influenza,inj,Quad PF,6+ Mos 08/24/2013, 10/06/2015    Health Maintenance  Topic Date Due  . Samul Dada  04/17/1966  . MAMMOGRAM  04/17/1997  . COLONOSCOPY  04/17/1997  . DEXA SCAN  04/17/2012  . PNA vac Low Risk Adult (1 of 2 - PCV13) 04/17/2012  . INFLUENZA VACCINE  04/30/2017  . HEMOGLOBIN A1C  09/20/2017  . OPHTHALMOLOGY EXAM  10/25/2017  . FOOT EXAM  03/06/2018  . Hepatitis C Screening  Completed     Discussed health benefits of physical activity, and encouraged her to engage in regular exercise appropriate for her age and condition.    --------------------------------------------------------------------  1. Annual physical exam General health stable. Needs mammograms, colonoscopy, Tdap, Prevnar and Flu vaccinations. Declines pelvic exam (asymptomatic). Needs to schedule BMD and get routine labs. Given anticipatory guidance. - CBC with Differential - Comprehensive metabolic panel - TSH - HgB A1c - Lipid panel  2. Essential (primary) hypertension Well controlled BP. Denies chest pains, palpitations, diaphoresis or dyspnea. No edema. Tolerating Prinzide 20-25 mg qd without muscle weakness or cramps. Recheck labs and follow up pending reports. - CBC with Differential - Comprehensive metabolic panel - TSH -  Lipid panel  3. Type 2 diabetes mellitus without complication, without long-term current use of insulin (HCC) Tolerating Metformin 500 mg BID with rare loose stool. Normal foot exam. Encouraged to get annual diabetic eye exam (states last exam was in December 2018). No polyuria or polydipsia. Will get routine follow up labs and recheck pending reports. - CBC with Differential - Comprehensive metabolic panel - HgB L4D - Lipid panel  4. Need for influenza vaccination - Flu vaccine HIGH DOSE PF  5. Need for Streptococcus pneumoniae vaccination - Pneumococcal conjugate vaccine 13-valent IM  6. Need for tetanus, diphtheria, and acellular pertussis (Tdap) vaccine - Tdap vaccine greater than or equal to 7yo IM  7. Breast cancer screening by mammogram - MM Digital Screening; Future  8. Screening for osteoporosis - DG Bone Density; Future  9. Screening for colon cancer - Ambulatory referral to Gastroenterology    Vernie Murders, [facility]

## 2017-12-04 NOTE — Patient Instructions (Signed)

## 2017-12-05 ENCOUNTER — Telehealth: Payer: Self-pay

## 2017-12-05 LAB — CBC WITH DIFFERENTIAL/PLATELET
BASOS: 0 %
Basophils Absolute: 0 10*3/uL (ref 0.0–0.2)
EOS (ABSOLUTE): 0.1 10*3/uL (ref 0.0–0.4)
Eos: 2 %
HEMATOCRIT: 36 % (ref 34.0–46.6)
HEMOGLOBIN: 12 g/dL (ref 11.1–15.9)
IMMATURE GRANS (ABS): 0 10*3/uL (ref 0.0–0.1)
IMMATURE GRANULOCYTES: 0 %
LYMPHS: 48 %
Lymphocytes Absolute: 2.5 10*3/uL (ref 0.7–3.1)
MCH: 30.1 pg (ref 26.6–33.0)
MCHC: 33.3 g/dL (ref 31.5–35.7)
MCV: 90 fL (ref 79–97)
MONOS ABS: 0.5 10*3/uL (ref 0.1–0.9)
Monocytes: 9 %
NEUTROS PCT: 41 %
Neutrophils Absolute: 2.2 10*3/uL (ref 1.4–7.0)
Platelets: 264 10*3/uL (ref 150–379)
RBC: 3.99 x10E6/uL (ref 3.77–5.28)
RDW: 13.3 % (ref 12.3–15.4)
WBC: 5.3 10*3/uL (ref 3.4–10.8)

## 2017-12-05 LAB — COMPREHENSIVE METABOLIC PANEL
A/G RATIO: 1.4 (ref 1.2–2.2)
ALT: 11 IU/L (ref 0–32)
AST: 15 IU/L (ref 0–40)
Albumin: 4 g/dL (ref 3.5–4.8)
Alkaline Phosphatase: 81 IU/L (ref 39–117)
BUN/Creatinine Ratio: 17 (ref 12–28)
BUN: 12 mg/dL (ref 8–27)
Bilirubin Total: 0.6 mg/dL (ref 0.0–1.2)
CALCIUM: 9.6 mg/dL (ref 8.7–10.3)
CO2: 29 mmol/L (ref 20–29)
CREATININE: 0.69 mg/dL (ref 0.57–1.00)
Chloride: 103 mmol/L (ref 96–106)
GFR, EST AFRICAN AMERICAN: 102 mL/min/{1.73_m2} (ref >59)
GFR, EST NON AFRICAN AMERICAN: 88 mL/min/{1.73_m2} (ref >59)
GLOBULIN, TOTAL: 2.8 g/dL (ref 1.5–4.5)
Glucose: 112 mg/dL — ABNORMAL HIGH (ref 65–99)
POTASSIUM: 3.6 mmol/L (ref 3.5–5.2)
SODIUM: 146 mmol/L — AB (ref 134–144)
TOTAL PROTEIN: 6.8 g/dL (ref 6.0–8.5)

## 2017-12-05 LAB — LIPID PANEL
Chol/HDL Ratio: 3.5 ratio (ref 0.0–4.4)
Cholesterol, Total: 138 mg/dL (ref 100–199)
HDL: 39 mg/dL — ABNORMAL LOW
LDL Calculated: 86 mg/dL (ref 0–99)
Triglycerides: 66 mg/dL (ref 0–149)
VLDL Cholesterol Cal: 13 mg/dL (ref 5–40)

## 2017-12-05 LAB — TSH: TSH: 3.33 u[IU]/mL (ref 0.450–4.500)

## 2017-12-05 LAB — HEMOGLOBIN A1C
Est. average glucose Bld gHb Est-mCnc: 146 mg/dL
Hgb A1c MFr Bld: 6.7 % — ABNORMAL HIGH (ref 4.8–5.6)

## 2017-12-05 NOTE — Telephone Encounter (Signed)
LMTCB

## 2017-12-05 NOTE — Telephone Encounter (Signed)
-----   Message from Margo Common, [place] sent at 12/05/2017  8:07 AM EST ----- Cholesterol normal. No anemia or signs of infection. Normal liver and kidney function. Blood sugar only slightly elevated. Hgb A1C in diabetic range but below the 7.0 goal (was 6.7). Continue present medications and recheck diabetes in 6 months.

## 2017-12-08 NOTE — Telephone Encounter (Signed)
Tried calling patient, and phone kept ringing. Will try again later.

## 2017-12-10 NOTE — Telephone Encounter (Signed)
Patient advised.KW 

## 2018-01-06 ENCOUNTER — Encounter: Payer: Self-pay | Admitting: *Deleted

## 2018-01-08 ENCOUNTER — Inpatient Hospital Stay: Admission: RE | Admit: 2018-01-08 | Payer: Self-pay | Source: Ambulatory Visit

## 2018-08-20 ENCOUNTER — Other Ambulatory Visit: Payer: Self-pay

## 2018-08-20 ENCOUNTER — Encounter: Payer: Self-pay | Admitting: Family Medicine

## 2018-08-20 ENCOUNTER — Ambulatory Visit (INDEPENDENT_AMBULATORY_CARE_PROVIDER_SITE_OTHER): Payer: BLUE CROSS/BLUE SHIELD | Admitting: Family Medicine

## 2018-08-20 VITALS — BP 132/84 | HR 62 | Temp 97.8°F | Ht 65.0 in | Wt 173.2 lb

## 2018-08-20 DIAGNOSIS — E119 Type 2 diabetes mellitus without complications: Secondary | ICD-10-CM

## 2018-08-20 DIAGNOSIS — Z23 Encounter for immunization: Secondary | ICD-10-CM

## 2018-08-20 DIAGNOSIS — R0609 Other forms of dyspnea: Secondary | ICD-10-CM

## 2018-08-20 DIAGNOSIS — I10 Essential (primary) hypertension: Secondary | ICD-10-CM

## 2018-08-20 DIAGNOSIS — D369 Benign neoplasm, unspecified site: Secondary | ICD-10-CM

## 2018-08-20 DIAGNOSIS — L910 Hypertrophic scar: Secondary | ICD-10-CM

## 2018-08-20 DIAGNOSIS — M069 Rheumatoid arthritis, unspecified: Secondary | ICD-10-CM | POA: Diagnosis not present

## 2018-08-20 LAB — POCT GLYCOSYLATED HEMOGLOBIN (HGB A1C): HEMOGLOBIN A1C: 6.8 % — AB (ref 4.0–5.6)

## 2018-08-20 NOTE — Progress Notes (Signed)
Patient: Krista Haley Female    DOB: Jan 29, 1946   72 y.o.   MRN: [id number] Visit Date: 08/20/2018  Today's Provider: Vernie Murders, PA   Chief Complaint  Patient presents with  . Diabetes    6 month f-up   Subjective:    HPI pt presents for a 6 month follow-up.  Last OV was 12/14/17.     Diabetes Mellitus Type II, Follow-up:   Lab Results  Component Value Date   HGBA1C 6.7 (H) 12/04/2017   HGBA1C 6.3 (H) 03/21/2017   HGBA1C 6.8 10/29/2016    Last seen for diabetes 12/14/17.  Management since then includes taking right medications given and trying to follow a good diet.. She reports good compliance with treatment. She is not having side effects.  Current symptoms include none and have been stable. Home blood sugar records: fasting range: 120's to 130's  Episodes of hypoglycemia? no   Current Insulin Regimen:none Most Recent Eye Exam: cataract surgery in April 2018  Weight trend: stable Prior visit with dietician: no Current diet: in general, a "healthy" diet   Current exercise: none  Pertinent Labs:    Component Value Date/Time   CHOL 138 12/04/2017 1519   TRIG 66 12/04/2017 1519   HDL 39 (L) 12/04/2017 1519   LDLCALC 86 12/04/2017 1519   CREATININE 0.69 12/04/2017 1519    Wt Readings from Last 3 Encounters:  08/20/18 173 lb 3.2 oz (78.6 kg)  12/04/17 173 lb 12.8 oz (78.8 kg)  03/21/17 175 lb 9.6 oz (79.7 kg)    ------------------------------------------------------------------------ Past Medical History:  Diagnosis Date  . Diabetes ([place])   . Hypertension    Past Surgical History:  Procedure Laterality Date  . CATARACT EXTRACTION, BILATERAL  ma[place] 2018, april 2018   surgery 2 weeks apart   . WRIST SURGERY Left    Family History  Problem Relation Age of Onset  . Diabetes Mother   . Hypertension Mother   . CVA Father   . Hypertension Father   . Heart attack Sister   . Diabetes Sister   . Kidney failure Sister   . COPD  Brother   . Diabetes Brother   . Prostate cancer Paternal Grandfather   . Ovarian cancer Sister   . Hypertension Sister   . Lung cancer Sister    No Known Allergies  Current Outpatient Medications:  .  ibuprofen (MOTRIN IB) 200 MG tablet, Take by mouth., Disp: , Rfl:  .  lisinopril-hydrochlorothiazide (PRINZIDE,ZESTORETIC) 20-25 MG tablet, Take 1 tablet by mouth daily., Disp: 90 tablet, Rfl: 3 .  metFORMIN (GLUCOPHAGE) 500 MG tablet, Take 1 tablet (500 mg total) by mouth 2 (two) times daily with a meal., Disp: 180 tablet, Rfl: 3 .  MULTIPLE VITAMINS-MINERALS PO, Take by mouth., Disp: , Rfl:   Review of Systems  Constitutional: Negative.   HENT: Negative.   Eyes: Negative.   Respiratory: Negative.   Cardiovascular: Negative.   Gastrointestinal: Negative.   Endocrine: Negative.   Genitourinary: Negative.   Musculoskeletal: Negative.   Skin: Negative.   Allergic/Immunologic: Negative.   Neurological: Negative.   Hematological: Negative.   Psychiatric/Behavioral: Negative.    Social History   Tobacco Use  . Smoking status: Never Smoker  . Smokeless tobacco: Never Used  Substance Use Topics  . Alcohol use: No    Alcohol/week: 0.0 standard drinks   Objective:   BP 132/84 (BP Location: Right Arm, Patient Position: Sitting, Cuff Size: Normal)   Pulse  62   Temp 97.8 F (36.6 C) (Oral)   Ht 5\' 5"  (1.651 m)   Wt 173 lb 3.2 oz (78.6 kg)   SpO2 97%   BMI 28.82 kg/m  Vitals:   08/20/18 1510  BP: 132/84  Pulse: 62  Temp: 97.8 F (36.6 C)  TempSrc: Oral  SpO2: 97%  Weight: 173 lb 3.2 oz (78.6 kg)  Height: 5\' 5"  (1.651 m)   Physical Exam  Constitutional: She is oriented to person, place, and time. She appears well-developed and well-nourished. No distress.  HENT:  Head: Normocephalic and atraumatic.  Right Ear: Hearing normal.  Left Ear: Hearing and external ear normal.  Nose: Nose normal.  Mouth/Throat: Oropharynx is clear and moist.  0.7 cm keloid on the back of  the right ear lobe.  Eyes: Conjunctivae and lids are normal. Right eye exhibits no discharge. Left eye exhibits no discharge. No scleral icterus.  Cardiovascular: Normal rate and regular rhythm.  Pulmonary/Chest: Effort normal and breath sounds normal. No respiratory distress.  Abdominal: Soft. Bowel sounds are normal.  Musculoskeletal: Normal range of motion.  Neurological: She is alert and oriented to person, place, and time.  Skin: Skin is intact. No lesion and no rash noted.  0.8 cm floppy flesh colored papilloma on top of the right shoulder.  Psychiatric: She has a normal mood and affect. Her speech is normal and behavior is normal. Thought content normal.   Diabetic Foot Form - Detailed   Diabetic Foot Exam - detailed Diabetic Foot exam was performed with the following findings:  Yes 08/20/2018  3:56 PM  Visual Foot Exam completed.:  Yes  Can the patient see the bottom of their feet?:  Yes Are the shoes appropriate in style and fit?:  Yes Is there swelling or and abnormal foot shape?:  No Is there a claw toe deformity?:  No Is there elevated skin temparature?:  No Is there foot or ankle muscle weakness?:  No Normal Range of Motion:  Yes Pulse Foot Exam completed.:  Yes  Right posterior Tibialias:  Present   Right Dorsalis Pedis:  Present   Sensory Foot Exam Completed.:  Yes Semmes-Weinstein Monofilament Test R Site 1-Great Toe:  Pos L Site 1-Great Toe:  Pos          Assessment & Plan:     1. Type 2 diabetes mellitus without complication, without long-term current use of insulin (HCC) Feeling well without polydipsia, polyphagia or polyuria. Gets regular ophthalmology exam. No peripheral neuropathy. Hgb A1C 6.8 today. Continues to take Metformin 500 mg BID without side effects. No hypoglycemia. Normal foot exam today. Will get follow up labs and encouraged to work on weight loss with diet and exercise. Recheck pending lab reports. - POCT glycosylated hemoglobin (Hb A1C) - CBC  with Differential/Platelet - Comprehensive metabolic panel - Lipid panel - TSH  2. Essential (primary) hypertension Tolerating Prinzide 20-25 mg qd without side effects. Will get routine follow up labs and EKG essentially unchanged from previous low voltage tracing in 2012. Follow up pending reports. - EKG 12-Lead - Comprehensive metabolic panel - Lipid panel - TSH  3. DOE (dyspnea on exertion) Noticed some shortness of breath with walking or climbing stairs over the past 8-9 months. No cough, sputum production or wheezing. Concerned about occasional chest discomfort. EKG does not show any acute changes. Pulse oximetry 97% today. May need to schedule spirometry if recurrent. - EKG 12-Lead  4. Rheumatoid arthritis involving multiple sites, unspecified rheumatoid factor presence ([place]) Developed  swelling, stiffness, achiness and decreased ROM of the PIP joints of 2nd - 5th fingers of both hands over the past several weeks. Usually worse in the mornings. No joint deviation or swan neck deformity. May use Advil or Aleve and will get RA profile.   - Rheumatoid Arthritis Profile  5. Keloid Has a 0.7 cm pedunculated keloid/cyst behind the right ear lobe near a previous ear piercing. Schedule for removal in the near future.  6. Papilloma 0.8 cm benign skin tag/papilloma on top of the right shoulder. No bleeding, erythema or pigmentation. Scheduled for removal.  7. Need for influenza vaccination - Flu vaccine HIGH DOSE PF (Fluzone High dose)       Vernie Murders, PA  [facility]

## 2018-08-23 LAB — CBC WITH DIFFERENTIAL/PLATELET
BASOS ABS: 0 10*3/uL (ref 0.0–0.2)
Basos: 1 %
EOS (ABSOLUTE): 0.1 10*3/uL (ref 0.0–0.4)
EOS: 2 %
Hematocrit: 36.7 % (ref 34.0–46.6)
Hemoglobin: 12.6 g/dL (ref 11.1–15.9)
IMMATURE GRANULOCYTES: 0 %
Immature Grans (Abs): 0 10*3/uL (ref 0.0–0.1)
Lymphocytes Absolute: 1.8 10*3/uL (ref 0.7–3.1)
Lymphs: 35 %
MCH: 30.4 pg (ref 26.6–33.0)
MCHC: 34.3 g/dL (ref 31.5–35.7)
MCV: 88 fL (ref 79–97)
Monocytes Absolute: 0.5 10*3/uL (ref 0.1–0.9)
Monocytes: 9 %
NEUTROS PCT: 53 %
Neutrophils Absolute: 2.7 10*3/uL (ref 1.4–7.0)
PLATELETS: 263 10*3/uL (ref 150–450)
RBC: 4.15 x10E6/uL (ref 3.77–5.28)
RDW: 12.1 % — ABNORMAL LOW (ref 12.3–15.4)
WBC: 5 10*3/uL (ref 3.4–10.8)

## 2018-08-23 LAB — LIPID PANEL
CHOLESTEROL TOTAL: 169 mg/dL (ref 100–199)
Chol/HDL Ratio: 4.2 ratio (ref 0.0–4.4)
HDL: 40 mg/dL (ref >39)
LDL CALC: 115 mg/dL — AB (ref 0–99)
TRIGLYCERIDES: 69 mg/dL (ref 0–149)
VLDL Cholesterol Cal: 14 mg/dL (ref 5–40)

## 2018-08-23 LAB — RHEUMATOID ARTHRITIS PROFILE
CYCLIC CITRULLIN PEPTIDE AB: 10 U (ref 0–19)
Rhuematoid fact SerPl-aCnc: <10 IU/mL (ref 0.0–13.9)

## 2018-08-23 LAB — COMPREHENSIVE METABOLIC PANEL
ALT: 12 IU/L (ref 0–32)
AST: 13 IU/L (ref 0–40)
Albumin/Globulin Ratio: 1.6 (ref 1.2–2.2)
Albumin: 4.1 g/dL (ref 3.5–4.8)
Alkaline Phosphatase: 76 IU/L (ref 39–117)
BUN/Creatinine Ratio: 21 (ref 12–28)
BUN: 17 mg/dL (ref 8–27)
Bilirubin Total: 0.7 mg/dL (ref 0.0–1.2)
CALCIUM: 9.9 mg/dL (ref 8.7–10.3)
CO2: 25 mmol/L (ref 20–29)
Chloride: 102 mmol/L (ref 96–106)
Creatinine, Ser: 0.8 mg/dL (ref 0.57–1.00)
GFR calc Af Amer: 86 mL/min/{1.73_m2} (ref >59)
GFR, EST NON AFRICAN AMERICAN: 74 mL/min/{1.73_m2} (ref >59)
GLUCOSE: 100 mg/dL — AB (ref 65–99)
Globulin, Total: 2.5 g/dL (ref 1.5–4.5)
Potassium: 3.8 mmol/L (ref 3.5–5.2)
Sodium: 142 mmol/L (ref 134–144)
Total Protein: 6.6 g/dL (ref 6.0–8.5)

## 2018-08-23 LAB — TSH: TSH: 1.63 u[IU]/mL (ref 0.450–4.500)

## 2018-10-17 ENCOUNTER — Ambulatory Visit (INDEPENDENT_AMBULATORY_CARE_PROVIDER_SITE_OTHER): Payer: BLUE CROSS/BLUE SHIELD | Admitting: Family Medicine

## 2018-10-17 ENCOUNTER — Encounter: Payer: Self-pay | Admitting: Family Medicine

## 2018-10-17 VITALS — BP 131/79 | HR 66 | Temp 98.7°F | Resp 16 | Wt 175.0 lb

## 2018-10-17 DIAGNOSIS — J069 Acute upper respiratory infection, unspecified: Secondary | ICD-10-CM

## 2018-10-17 NOTE — Progress Notes (Signed)
  Subjective:     Patient ID: Krista Haley, female   DOB: Nov 25, 1945, 73 y.o.   MRN: [id number] Chief Complaint  Patient presents with  . URI    x 3 days   HPI Describes cold sx for the last 3 days without fever. + flu shot. Has been taking Robitussin DM for her sx. 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  Review of Systems     Objective:   Physical Exam Constitutional:      General: She is not in acute distress.    Appearance: She is ill-appearing.  Neurological:     Mental Status: She is alert.   Ears: T.M's obscured by cerumen Throat: tonsils absent Neck: no cervical adenopathy Lungs: clear     Assessment:    1. URI, acute    Plan:    Discussed use of decongestants and Robitussin DM. Work excuse for 1/17-1/19/20. Call if not improving over the next few days.

## 2018-10-17 NOTE — Patient Instructions (Signed)
Continue Robitussin DM and add decongestants like Sudafed PE. Let us know if not improving over the next several days.

## 2018-10-17 NOTE — Progress Notes (Deleted)
  Subjective:     Patient ID: Krista Haley, female   DOB: June 07, 1946, 73 y.o.   MRN: [id number]  HPI   Review of Systems     Objective:   Physical Exam     Assessment:     ***    Plan:     ***

## 2018-10-27 ENCOUNTER — Ambulatory Visit (INDEPENDENT_AMBULATORY_CARE_PROVIDER_SITE_OTHER): Payer: BLUE CROSS/BLUE SHIELD | Admitting: Family Medicine

## 2018-10-27 ENCOUNTER — Other Ambulatory Visit: Payer: Self-pay

## 2018-10-27 ENCOUNTER — Encounter: Payer: Self-pay | Admitting: Family Medicine

## 2018-10-27 VITALS — BP 156/102 | HR 70 | Temp 98.0°F | Ht 65.0 in | Wt 176.2 lb

## 2018-10-27 DIAGNOSIS — J209 Acute bronchitis, unspecified: Secondary | ICD-10-CM | POA: Diagnosis not present

## 2018-10-27 MED ORDER — DOXYCYCLINE HYCLATE 100 MG PO TABS: 100.0000 mg | ORAL_TABLET | Freq: Two times a day (BID) | ORAL | 0 refills | Status: AC

## 2018-10-27 NOTE — Progress Notes (Signed)
  Subjective:     Patient ID: Noel Journey, female   DOB: 08-04-1946, 73 y.o.   MRN: [id number] Chief Complaint  Patient presents with  . Wheezing    in chest, dry cough, non-productive.  at times mucus is thick white and light yellow, headache since 10/17/18   HPI She is approximately two weeks out from onset of a URI (o.v. 1/18). States she is having a persistent cough with occasional production of purulent sputum: "I feel worse." Reports persistent sinus congestion with clear drainage. She has been taking Sudafed and robitussin for her sx.  Review of Systems     Objective:   Physical Exam Constitutional:      General: She is not in acute distress.    Appearance: She is ill-appearing.  Neurological:     Mental Status: She is alert.   Ears: T.M's obscured by cerumen Throat: no tonsillar enlargement or exudate Neck: no cervical adenopathy Lungs: clear     Assessment:    1. Acute bronchitis, unspecified organism - doxycycline (VIBRA-TABS) 100 MG tablet; Take 1 tablet (100 mg total) by mouth 2 (two) times daily.  Dispense: 14 tablet; Refill: 0    Plan:    Stop Sudafed due to bp elevation. Discussed use of Robitussin DM, saline nasal spray and Clariitin for drip.

## 2018-10-27 NOTE — Patient Instructions (Addendum)
Take Robitussin DM for cough. Stop Sudafed as your blood pressure is up. Use salt water spray for sinus congestion; Claritin for nasal drip.

## 2018-12-10 ENCOUNTER — Encounter: Payer: BLUE CROSS/BLUE SHIELD | Admitting: Obstetrics and Gynecology

## 2020-08-04 ENCOUNTER — Ambulatory Visit (LOCAL_COMMUNITY_HEALTH_CENTER): Payer: Self-pay

## 2020-08-04 ENCOUNTER — Other Ambulatory Visit: Payer: Self-pay

## 2020-08-04 DIAGNOSIS — Z111 Encounter for screening for respiratory tuberculosis: Secondary | ICD-10-CM

## 2020-08-07 ENCOUNTER — Other Ambulatory Visit: Payer: Self-pay

## 2020-08-07 ENCOUNTER — Ambulatory Visit (LOCAL_COMMUNITY_HEALTH_CENTER): Payer: Self-pay

## 2020-08-07 DIAGNOSIS — Z111 Encounter for screening for respiratory tuberculosis: Secondary | ICD-10-CM

## 2020-08-07 LAB — TB SKIN TEST
Induration: 0 mm
TB Skin Test: NEGATIVE
